# Patient Record
Sex: Male | Born: 1975 | Hispanic: Yes | Marital: Single | Smoking: Never smoker
Health system: Southern US, Community
[De-identification: ages and names within clinical notes are randomized; demographics above are authoritative.]

---

## 2014-04-02 ENCOUNTER — Emergency Department (HOSPITAL_COMMUNITY)
Admission: EM | Admit: 2014-04-02 | Discharge: 2014-04-02 | Disposition: A | Discharge status: Home / Self Care | Payer: Worker's Compensation | Attending: Emergency Medicine | Admitting: Emergency Medicine

## 2014-04-02 ENCOUNTER — Encounter (HOSPITAL_COMMUNITY): Payer: Self-pay | Admitting: Emergency Medicine

## 2014-04-02 ENCOUNTER — Telehealth: Payer: Self-pay | Admitting: Orthopedic Surgery

## 2014-04-02 ENCOUNTER — Emergency Department (HOSPITAL_COMMUNITY): Payer: Worker's Compensation

## 2014-04-02 DIAGNOSIS — M25561 Pain in right knee: Secondary | ICD-10-CM

## 2014-04-02 DIAGNOSIS — Y9339 Activity, other involving climbing, rappelling and jumping off: Secondary | ICD-10-CM | POA: Insufficient documentation

## 2014-04-02 DIAGNOSIS — Y9289 Other specified places as the place of occurrence of the external cause: Secondary | ICD-10-CM | POA: Insufficient documentation

## 2014-04-02 DIAGNOSIS — Y990 Civilian activity done for income or pay: Secondary | ICD-10-CM | POA: Insufficient documentation

## 2014-04-02 DIAGNOSIS — S99929A Unspecified injury of unspecified foot, initial encounter: Principal | ICD-10-CM

## 2014-04-02 DIAGNOSIS — S99919A Unspecified injury of unspecified ankle, initial encounter: Principal | ICD-10-CM

## 2014-04-02 DIAGNOSIS — X500XXA Overexertion from strenuous movement or load, initial encounter: Secondary | ICD-10-CM | POA: Insufficient documentation

## 2014-04-02 DIAGNOSIS — S8990XA Unspecified injury of unspecified lower leg, initial encounter: Secondary | ICD-10-CM | POA: Insufficient documentation

## 2014-04-02 MED ORDER — OXYCODONE-ACETAMINOPHEN 5-325 MG PO TABS: 2.0000 | ORAL_TABLET | ORAL | Status: AC | PRN

## 2014-04-02 NOTE — Discharge Instructions (Signed)
X-ray shows no fracture. Ice, elevate, knee brace, pain medication. Can also take ibuprofen 3-4 tablets 3 times a day. Work note given

## 2014-04-02 NOTE — ED Notes (Signed)
nad noted prior to dc. Dc instructions reviewed with pt and f/u instructions reviewed as well. Workers comp sheet reviewed and explained to give to employer as well. 1 script given prior to dc home . Pt encouraged to go to lab for drug screen

## 2014-04-02 NOTE — ED Provider Notes (Signed)
CSN: [id number]     Arrival date & time 04/02/14  0423 History   First MD Initiated Contact with Patient 04/02/14 0425     No chief complaint on file.  Chief complaint: Right knee injury  (Consider location/radiation/quality/duration/timing/severity/associated sxs/prior Treatment) HPI..... 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 Knee inverted and patient felt popping sensation. No other injuries. Severity is moderate. He cannot bear weight on his right leg today. He is normally healthy.  History reviewed. No pertinent past medical history. History reviewed. No pertinent past surgical history. No family history on file. History  Substance Use Topics  . Smoking status: Never Smoker   . Smokeless tobacco: Not on file  . Alcohol Use: No    Review of Systems  All other systems reviewed and are negative.     Allergies  Review of patient's allergies indicates no known allergies.  Home Medications   Prior to Admission medications   Not on File   BP 115/73  Pulse 58  Temp(Src) 98 F (36.7 C) (Oral)  Resp 16  Ht 5\' 8"  (1.727 m)  Wt 196 lb (88.905 kg)  BMI 29.81 kg/m2  SpO2 100% Physical Exam  Nursing note and vitals reviewed. Constitutional: He is oriented to person, place, and time. He appears well-developed and well-nourished.  HENT:  Head: Normocephalic and atraumatic.  Eyes: Conjunctivae and EOM are normal. Pupils are equal, round, and reactive to light.  Neck: Normal range of motion. Neck supple.  Cardiovascular: Normal rate, regular rhythm and normal heart sounds.   Pulmonary/Chest: Effort normal and breath sounds normal.  Abdominal: Soft. Bowel sounds are normal.  Musculoskeletal:  Right knee:   Most tender medially. Pain with range of motion  Neurological: He is alert and oriented to person, place, and time.  Skin: Skin is warm and dry.  Psychiatric: He has a normal mood and affect. His behavior is normal.    ED Course  Procedures  (including critical care time) Labs Review Labs Reviewed - No data to display  Imaging Review Dg Knee Complete 4 Views Right  04/02/2014   CLINICAL DATA:  Medial pain after inversion injury.  EXAM: RIGHT KNEE - COMPLETE 4+ VIEW  COMPARISON:  None.  FINDINGS: There is no evidence of fracture, dislocation, or joint effusion. There is no evidence of arthropathy or other focal bone abnormality. Soft tissues are unremarkable.  IMPRESSION: Negative.   Electronically Signed   By: Burman NievesWilliam  Stevens M.D.   On: 04/02/2014 06:24     EKG Interpretation None      MDM   Final diagnoses:  Right knee pain    X-ray of right knee shows no fracture. However I am concerned about a MCL injury or possible meniscus tear.  Followup with orthopedics. Ice. Immobilized. Discharge medications Percocet    Donnetta HutchingBrian Brennon Otterness, MD 04/02/14 [phone]

## 2014-04-02 NOTE — Telephone Encounter (Signed)
Received call from patient (in background), and from patient's sister-in-law, translating, that patient was treated at [facility] last night for a twisting injury to right knee (? At work)    * I called patient back to offer appointment and to obtain additional information; no voice message was set up on the phone-tried 2 x; no alternate ph# available.

## 2014-04-02 NOTE — ED Notes (Signed)
Pt states he jumped off of a roof yesterday, has injury and pain to right knee, no obvious deformity or swelling noted

## 2014-04-06 NOTE — Telephone Encounter (Signed)
No additional phone note on this encounter. See phone note same date.

## 2014-04-06 NOTE — Telephone Encounter (Signed)
Appointment scheduled, as the employer's portion of Worker's comp set up form received in fax.  Patient aware of appointment tomorrow, 04/07/14, and to arrive by 1:45p.m.  I also notified employer of the appointment, left voice message.

## 2014-04-06 NOTE — Telephone Encounter (Signed)
Received return call back Friday per Byrd HesselbachMaria, family member who translates for patient.  Explained the Workers Comp process.  She provided the name, phone and fax # of employer:  [employer], ph# [phone],  fax # [phone]).  Sent the workers comp set up forms tor patient's employer to complete; patient can complete his portion here with new pt forms, upon receipt.  Patient aware of status - appointment pending receipt of completed forms.

## 2014-04-07 ENCOUNTER — Ambulatory Visit (INDEPENDENT_AMBULATORY_CARE_PROVIDER_SITE_OTHER): Payer: Worker's Compensation | Admitting: Orthopedic Surgery

## 2014-04-07 ENCOUNTER — Encounter: Payer: Self-pay | Admitting: Orthopedic Surgery

## 2014-04-07 VITALS — BP 158/92

## 2014-04-07 DIAGNOSIS — IMO0002 Reserved for concepts with insufficient information to code with codable children: Secondary | ICD-10-CM

## 2014-04-07 DIAGNOSIS — S8390XA Sprain of unspecified site of unspecified knee, initial encounter: Secondary | ICD-10-CM

## 2014-04-07 DIAGNOSIS — S83209A Unspecified tear of unspecified meniscus, current injury, unspecified knee, initial encounter: Secondary | ICD-10-CM

## 2014-04-07 MED ORDER — HYDROCODONE-ACETAMINOPHEN 7.5-325 MG PO TABS: 1.0000 | ORAL_TABLET | ORAL | Status: DC | PRN

## 2014-04-07 MED ORDER — IBUPROFEN 800 MG PO TABS: 800.0000 mg | ORAL_TABLET | Freq: Three times a day (TID) | ORAL | Status: DC | PRN

## 2014-04-07 NOTE — Progress Notes (Signed)
Patient ID: Jason Robertson, male   DOB: 02/26/1976, 38 y.o.   MRN: [id number]  Chief Complaint  Patient presents with  . Knee Pain   38 year old male was injured at work. He was working on a roof he somehow got his foot caught and he fell injuring his right knee he cannot put pressure on his right foot. Complains of pain swelling locking stiffness giving out sharp throbbing stabbing discomfort constant 8/10 with mild relief from oxycodone 5. He went to the hospital x-rays were negative. Injury date was May 13 it was work-related  Is negative medical history negative surgical history is medication as stated oxycodone. He is also wearing a brace he is using crutches  No medical allergies  Family history of hypertension gastrointestinal disease parents are alive and well  Review of systems nothing other than stated.  He listed date of injury of May 13 time 2:30 while employed by [employer]. Psychologist, counsellingsteel service.  BP 158/92 General appearance is normal, the patient is alert and oriented x3 with normal mood and affect.  Respirations 18 pulse 78 regular  Crutches nonweightbearing brace.Upper extremity exam  The right and left upper extremity:   Inspection and palpation revealed no abnormalities in the upper extremities.   Range of motion is full without contracture.  Motor exam is normal with grade 5 strength.  The joints are fully reduced without subluxation.  There is no atrophy or tremor and muscle tone is normal.  All joints are stable.   Left lower extremity inspection no abnormalities, full range of motion, joint stability confirmed strength normal skin intact good pulses normal sensation  Right leg tender medial joint line medial collateral ligament. Range of motion limited to 50. Anterior cruciate ligament intact strength normal skin normal muscle tone normal pulses normal no lymph nodes are positive, sensation is normal. No pathologic reflexes  Balance normal.  X-rays  negative  Impression sprain knee probable MCL recommend MRI right knee  Meds ordered this encounter  Medications  . ibuprofen (ADVIL,MOTRIN) 800 MG tablet    Sig: Take 1 tablet (800 mg total) by mouth every 8 (eight) hours as needed.    Dispense:  90 tablet    Refill:  5  . HYDROcodone-acetaminophen (NORCO) 7.5-325 MG per tablet    Sig: Take 1 tablet by mouth every 4 (four) hours as needed for moderate pain.    Dispense:  84 tablet    Refill:  0   Orders Placed This Encounter  Procedures  . MR Knee Right Wo Contrast    Standing Status: Future     Number of Occurrences:      Standing Expiration Date: 06/08/2015    Order Specific Question:  Reason for Exam (SYMPTOM  OR DIAGNOSIS REQUIRED)    Answer:  meniscal tear    Order Specific Question:  Preferred imaging location?    Answer:  External    Order Specific Question:  Does the patient have a pacemaker or implanted devices?    Answer:  No    Order Specific Question:  What is the patient's sedation requirement?    Answer:  No Sedation    No work continue brace crutches weight-bear as tolerated medications ordered. Currently in a hinged economy brace

## 2014-04-07 NOTE — Patient Instructions (Addendum)
No work until diagnosis completed with MRI scan  Mri right knee take medications apply ice 3 x a day for 30 min   Meds ordered this encounter  Medications  . ibuprofen (ADVIL,MOTRIN) 800 MG tablet    Sig: Take 1 tablet (800 mg total) by mouth every 8 (eight) hours as needed.    Dispense:  90 tablet    Refill:  5  . HYDROcodone-acetaminophen (NORCO) 7.5-325 MG per tablet    Sig: Take 1 tablet by mouth every 4 (four) hours as needed for moderate pain.    Dispense:  84 tablet    Refill:  0

## 2014-04-08 ENCOUNTER — Telehealth: Payer: Self-pay | Admitting: Orthopedic Surgery

## 2014-04-08 NOTE — Telephone Encounter (Signed)
Contacted Workers Emergency planning/management officerComp insurer, [employer], spoke with adjuster assigned to the claim ([id number]), Building services engineerTimmy Baker.  Faxed notes, including out of work note, and order for MRI, and have requesting approval/authorization.  Awaiting response.  Patient aware of status.  (PH# [phone], FAX# [phone])

## 2014-04-15 NOTE — Telephone Encounter (Signed)
04/15/14 Received call back from [employer] requesting re-fax of same office notes w/MRI order which were faxed 04/08/14 and to include CLAIM# [id number] -- RE-faxed to same Fax # [phone], attention Harvie Heck, as per request.

## 2014-04-16 NOTE — Telephone Encounter (Signed)
Received approval via fax from Workers Comp's 3rd party Runner, broadcasting/film/video, [employer].  MRI has been pre-authorized; appointment scheduled at [facility], [facility] for tomorrow 04/17/14, 11:30am, pt aware. Order faxed to [phone] (ph [phone])  Patient to follow up here for results; appointment scheduled, and patient aware of this visit as well.

## 2014-04-23 ENCOUNTER — Ambulatory Visit (INDEPENDENT_AMBULATORY_CARE_PROVIDER_SITE_OTHER): Payer: Worker's Compensation | Admitting: Orthopedic Surgery

## 2014-04-23 ENCOUNTER — Encounter: Payer: Self-pay | Admitting: Orthopedic Surgery

## 2014-04-23 ENCOUNTER — Telehealth: Payer: Self-pay | Admitting: Orthopedic Surgery

## 2014-04-23 DIAGNOSIS — S83419A Sprain of medial collateral ligament of unspecified knee, initial encounter: Secondary | ICD-10-CM | POA: Insufficient documentation

## 2014-04-23 DIAGNOSIS — IMO0002 Reserved for concepts with insufficient information to code with codable children: Secondary | ICD-10-CM

## 2014-04-23 DIAGNOSIS — S8390XA Sprain of unspecified site of unspecified knee, initial encounter: Secondary | ICD-10-CM

## 2014-04-23 MED ORDER — HYDROCODONE-ACETAMINOPHEN 7.5-325 MG PO TABS: 1.0000 | ORAL_TABLET | ORAL | Status: DC | PRN

## 2014-04-23 NOTE — Telephone Encounter (Signed)
Faxed office notes and request for physical therapy provider for therapy appointment to be scheduled, Ref: CLAIM# [id number] --faxed to same Fax # [phone], attention Harvie Heck, as per request.

## 2014-04-23 NOTE — Patient Instructions (Addendum)
Wear brace for next 6 weeks OOW note Therapy to be approved by workers comp

## 2014-04-23 NOTE — Progress Notes (Signed)
Patient ID: Jason Robertson, male   DOB: 03/18/1976, 38 y.o.   MRN: [id number]  Chief Complaint  Patient presents with  . Follow-up    Workers compensation related injury May 13 right knee, MRI followup   Previous history HISTORY: Chief Complaint   Patient presents with   .  Knee Pain    38 year old male was injured at work. He was working on a roof he somehow got his foot caught and he fell injuring his right knee he cannot put pressure on his right foot. Complains of pain swelling locking stiffness giving out sharp throbbing stabbing discomfort constant 8/10 with mild relief from oxycodone 5. He went to the hospital x-rays were negative. Injury date was May 13 it was work-related  negative medical history negative surgical history is medication as stated oxycodone. He is also wearing a brace he is using crutches  Current complaints medial knee pain stiffness  Review of systems no new findings  Current treatment hinged knee brace, hydrocodone, anti-inflammatory and crutches.  Image interpretation and review of report  There is a bone contusion laterally and there is a proximal MCL tear with no anterior cruciate ligament tear no meniscal tear  Recommend physical therapy and continued bracing for 6 weeks without work status 6 weeks return 6 weeks continue current medication  Orders Placed This Encounter  Procedures  . Ambulatory referral to Physical Therapy    Referral Priority:  Routine    Referral Type:  Physical Medicine    Referral Reason:  Specialty Services Required    Requested Specialty:  Physical Therapy    Number of Visits Requested:  1   Meds ordered this encounter  Medications  . HYDROcodone-acetaminophen (NORCO) 7.5-325 MG per tablet    Sig: Take 1 tablet by mouth every 4 (four) hours as needed for moderate pain.    Dispense:  84 tablet    Refill:  0

## 2014-04-28 NOTE — Telephone Encounter (Signed)
Regarding physical therapy - Received fax from Workers Comp third Sales promotion account executive, [employer], requesting orders.  Faxed as requested 04/27/14 to [phone] as per request. * * 04/28/14, [employer] has faxed back approval/authorization for patient to be scheduled at:  [facility]) Ph# [phone], Fax [phone].  Orders faxed to their office --  appointment pending patient contacting them to schedule.  I have also tried to reach patient - voice message not working. * Keep following up *

## 2014-04-28 NOTE — Telephone Encounter (Signed)
Appointment is scheduled for physical therapy at [facility], [facility] location for 3/54/65, 8:30am, patient aware.

## 2014-06-11 ENCOUNTER — Telehealth: Payer: Self-pay | Admitting: Orthopedic Surgery

## 2014-06-11 ENCOUNTER — Ambulatory Visit (INDEPENDENT_AMBULATORY_CARE_PROVIDER_SITE_OTHER): Payer: Worker's Compensation | Admitting: Orthopedic Surgery

## 2014-06-11 ENCOUNTER — Encounter: Payer: Self-pay | Admitting: Orthopedic Surgery

## 2014-06-11 VITALS — BP 125/84 | Ht 68.0 in | Wt 196.0 lb

## 2014-06-11 DIAGNOSIS — Z5189 Encounter for other specified aftercare: Secondary | ICD-10-CM

## 2014-06-11 DIAGNOSIS — IMO0002 Reserved for concepts with insufficient information to code with codable children: Secondary | ICD-10-CM

## 2014-06-11 DIAGNOSIS — S8391XD Sprain of unspecified site of right knee, subsequent encounter: Secondary | ICD-10-CM

## 2014-06-11 DIAGNOSIS — S83419A Sprain of medial collateral ligament of unspecified knee, initial encounter: Secondary | ICD-10-CM

## 2014-06-11 DIAGNOSIS — S83411D Sprain of medial collateral ligament of right knee, subsequent encounter: Secondary | ICD-10-CM

## 2014-06-11 MED ORDER — HYDROCODONE-ACETAMINOPHEN 7.5-325 MG PO TABS: 1.0000 | ORAL_TABLET | ORAL | Status: DC | PRN

## 2014-06-11 MED ORDER — IBUPROFEN 800 MG PO TABS: 800.0000 mg | ORAL_TABLET | Freq: Three times a day (TID) | ORAL | Status: AC | PRN

## 2014-06-11 NOTE — Progress Notes (Signed)
Patient ID: Jason Robertson, male   DOB: [birth year], 38 y.o.   MRN: [id number]  Chief Complaint  Patient presents with  . Follow-up    recheck right knee s/p therapy, DOI 04/01/14    Encounter Diagnoses  Name Primary?  . Tear of MCL (medial collateral ligament) of knee, right, subsequent encounter Yes  . Sprain, knee, right, subsequent encounter     BP 125/84  Ht 5\' 8"  (1.727 m)  Wt 196 lb (88.905 kg)  BMI 29.81 kg/m2   MCL sprain right knee 8 weeks since injury plus a couple of days. Still complains of medial knee pain and inability to kneel and inability to squat. He has made improvements in terms of regaining full range of motion no swelling  Is still tender over the medial femoral condyle. He is ambulating normally now. The knee is stable but has pain with valgus stress motor exam is normal as a good distal pulse and normal sensation  Continue therapy 4 weeks Out of work 4 weeks Return 4 weeks

## 2014-06-11 NOTE — Telephone Encounter (Signed)
Notes faxed, date of service 06/11/14, including physical therapy order for continued therapy, to Workers comp insurer, [employer], to  fax# [phone], ph# [phone].  Awaiting approval [facility] [place].  Patient aware of status.

## 2014-06-11 NOTE — Patient Instructions (Signed)
CONTINUE THERAPY FOUR MORE WEEKS OOW FOUR MORE WEEKS FOLLOW UP IN FOUR WEEKS

## 2014-06-16 NOTE — Telephone Encounter (Signed)
06/15/14 Spoke with Workers comp ([employer]), Randi; therapy approved for additional 4 weeks. States Management consultantAlign Networks(their 3rd party provider) may also contact us directly. Order faxed to [facility], fax# [phone];  Called patient to notify, left message to return call if needed further information.

## 2014-06-24 ENCOUNTER — Encounter: Payer: Self-pay | Admitting: Orthopedic Surgery

## 2014-07-09 ENCOUNTER — Ambulatory Visit (INDEPENDENT_AMBULATORY_CARE_PROVIDER_SITE_OTHER): Payer: Worker's Compensation | Admitting: Orthopedic Surgery

## 2014-07-09 ENCOUNTER — Encounter: Payer: Self-pay | Admitting: Orthopedic Surgery

## 2014-07-09 VITALS — BP 140/87 | Ht 68.0 in | Wt 196.0 lb

## 2014-07-09 DIAGNOSIS — S8391XD Sprain of unspecified site of right knee, subsequent encounter: Secondary | ICD-10-CM

## 2014-07-09 DIAGNOSIS — IMO0002 Reserved for concepts with insufficient information to code with codable children: Secondary | ICD-10-CM | POA: Diagnosis not present

## 2014-07-09 DIAGNOSIS — S83419A Sprain of medial collateral ligament of unspecified knee, initial encounter: Secondary | ICD-10-CM | POA: Diagnosis not present

## 2014-07-09 DIAGNOSIS — Z5189 Encounter for other specified aftercare: Secondary | ICD-10-CM

## 2014-07-09 DIAGNOSIS — S83411D Sprain of medial collateral ligament of right knee, subsequent encounter: Secondary | ICD-10-CM

## 2014-07-09 MED ORDER — HYDROCODONE-ACETAMINOPHEN 7.5-325 MG PO TABS: 1.0000 | ORAL_TABLET | ORAL | Status: DC | PRN

## 2014-07-09 NOTE — Progress Notes (Signed)
Chief Complaint  Patient presents with  . Follow-up    follow up Right knee s/p therapy, DOI 04/01/14    BP 140/87  Ht 5\' 8"  (1.727 m)  Wt 196 lb (88.905 kg)  BMI 29.81 kg/m2  Repeat evaluation for this patient with the workers compensation injury back in May of this year. Injured his knee and sustained a medial collateral ligament injury as well as a bone contusion to the medial femoral condyle. He presents for further evaluation after finishing his physical therapy  His main complaints at this point of pain over the medial femoral condyle. He still having stiffness and occasional instability feeling in the knee. Using the knee brace is on ibuprofen and Norco for pain  Review of systems negative otherwise.  Exam shows tenderness over his medial femoral condyle medial tibial plateau he has slight opening but good springback with valgus stress at 30. Knee stable anteroposterior. He has 130 of flexion full extension of the right knee with 135 flexion left knee  At this point he still has some pain from the contusion to the knee. The knee feels very stable.  I think is a little slow to heal I think to speed things up it would be prudent to repeat his MRI to make sure that there is nothing else going on in the knee.  Otherwise out of work for 4 weeks, continue brace continue ibuprofen continue Norco. He will have a permanent partial impairment rating done in about 4 weeks when I see him next time.

## 2014-07-09 NOTE — Patient Instructions (Addendum)
OUT OF WORK 4 MORE WEEK WE WILL CALL YOU WITH MRI APPOINTMENT

## 2014-07-13 ENCOUNTER — Telehealth: Payer: Self-pay | Admitting: Orthopedic Surgery

## 2014-07-13 NOTE — Telephone Encounter (Signed)
Contacted Workers comp, Faxed notes + request for MRI authorization, to fax# [phone], ph# [phone]; awaiting approval; patient aware of status.

## 2014-07-17 NOTE — Telephone Encounter (Signed)
Per follow up call to Workers comp, spoke with [employer] claim adjuster [employer]; states we have verbal approval for MRI; awaiting "[employer]" process, which is their third party scheduling company.  Patient aware of status.

## 2014-07-28 NOTE — Telephone Encounter (Signed)
As of 07/23/14, fax received from [employer] (Work Comp third party contact) that patient was scheduled for MRI on 07/24/14 at [facility].  Called patient to notify; no voice message available.

## 2014-07-28 NOTE — Telephone Encounter (Signed)
Patient kept the MRI appointment as scheduled, 07/24/14, at [facility]; has a scheduled follow up appointment here for results.

## 2014-08-04 ENCOUNTER — Encounter: Payer: Self-pay | Admitting: Orthopedic Surgery

## 2014-08-06 ENCOUNTER — Encounter: Payer: Self-pay | Admitting: Orthopedic Surgery

## 2014-08-06 ENCOUNTER — Ambulatory Visit (INDEPENDENT_AMBULATORY_CARE_PROVIDER_SITE_OTHER): Payer: Worker's Compensation | Admitting: Orthopedic Surgery

## 2014-08-06 VITALS — BP 155/101 | Ht 68.0 in | Wt 196.0 lb

## 2014-08-06 DIAGNOSIS — IMO0002 Reserved for concepts with insufficient information to code with codable children: Secondary | ICD-10-CM

## 2014-08-06 DIAGNOSIS — Z5189 Encounter for other specified aftercare: Secondary | ICD-10-CM

## 2014-08-06 DIAGNOSIS — S8391XD Sprain of unspecified site of right knee, subsequent encounter: Secondary | ICD-10-CM

## 2014-08-06 MED ORDER — HYDROCODONE-ACETAMINOPHEN 7.5-325 MG PO TABS: 1.0000 | ORAL_TABLET | ORAL | Status: AC | PRN

## 2014-08-06 NOTE — Progress Notes (Signed)
Chief Complaint  Patient presents with  . Follow-up    MRI results Rt knee, w.comp, DOI 04/01/14    38 year old male was involved in a workers compensation injury injured his right knee sustained a medial collateral ligament injury he was still having pain after we thought the ligament had healed so we got approval for repeat MRI and it shows that his ligament did indeed feel. His bone contusions resolved. 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 All ligamentous and meniscal structures are intact  Today's examination reveals a well-developed well-nourished male he has a family relative with him who is interpreting for [place]. He is awake alert and oriented his mood and affect is normal he walks without support and without a limp. He still has tenderness over the medial femoral condyle there is no joint effusion the remaining portions of his knee are palpated and are nontender his range of motion is full he has some pain over the medial femoral condyle once he reaches 130 of flexion. His knee comes to full extension the collateral ligaments are stable the anterior cruciate ligament and PCL are stable he does have pain to valgus stress with the knee is at 30 but not at 0 muscle tone is normal skin is intact has good distal pulse and normal sensation distally.  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 No surgery deemed necessary.

## 2014-08-06 NOTE — Patient Instructions (Signed)
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

## 2014-08-12 ENCOUNTER — Telehealth: Payer: Self-pay | Admitting: Orthopedic Surgery

## 2014-08-12 NOTE — Telephone Encounter (Signed)
Faxed office visit notes date of service 08/06/14 to nurse case manager for workers comp insurer, [employer], fax# [phone]/ph# [phone].  She had accompanied patient to this visit; already has copy of work note.

## 2014-08-14 ENCOUNTER — Encounter: Payer: Self-pay | Admitting: Orthopedic Surgery

## 2014-08-31 ENCOUNTER — Telehealth: Payer: Self-pay | Admitting: Orthopedic Surgery

## 2014-08-31 NOTE — Telephone Encounter (Signed)
Patient's sister and designated party contact, Marlowe ShoresMaria Duque, called to relay that patient's employer has no light duty work available; therefore, has not been working - refer to last visit note of 08/06/14.  Patient has been treating for work-related injury, 04/02/14 as noted.    - I have also called and left message for Spring A, patient's nurse case manager.  No follow up appointment is scheduled at this time, pending appointment with another specialist.

## 2014-09-01 NOTE — Telephone Encounter (Signed)
Received call back from nurse case manager, Spring Abney, [place] [phone]; she explained that she is awaiting patient (and patient's contact, Byrd HesselbachMaria) to confirm the referral appointment for tomorrow, 09/01/14, with specialist regarding work hardening.  I called back to patient - they will call back to Spring, to further discuss attending the scheduled referral appointment.

## 2015-10-19 IMAGING — CR DG KNEE COMPLETE 4+V*R*
4 series · 4 of 4 positions shown · non-contrast
Comparison: None.

CLINICAL DATA: Medial pain after inversion injury.

EXAM:
RIGHT KNEE - COMPLETE 4+ VIEW

[view not recorded (1 of 4)]
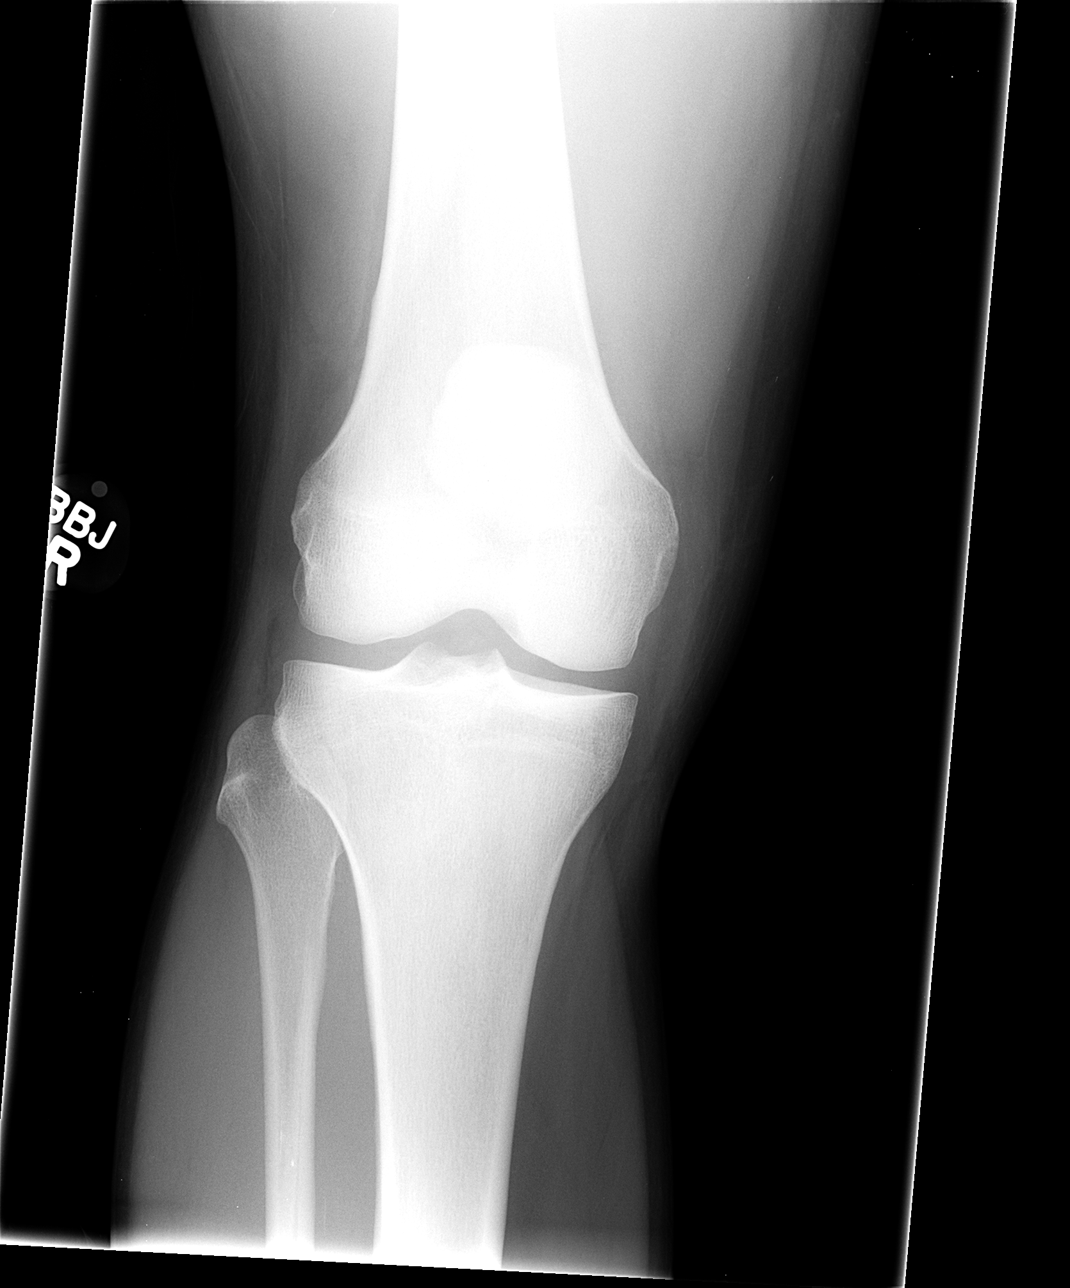

[view not recorded (2 of 4)]
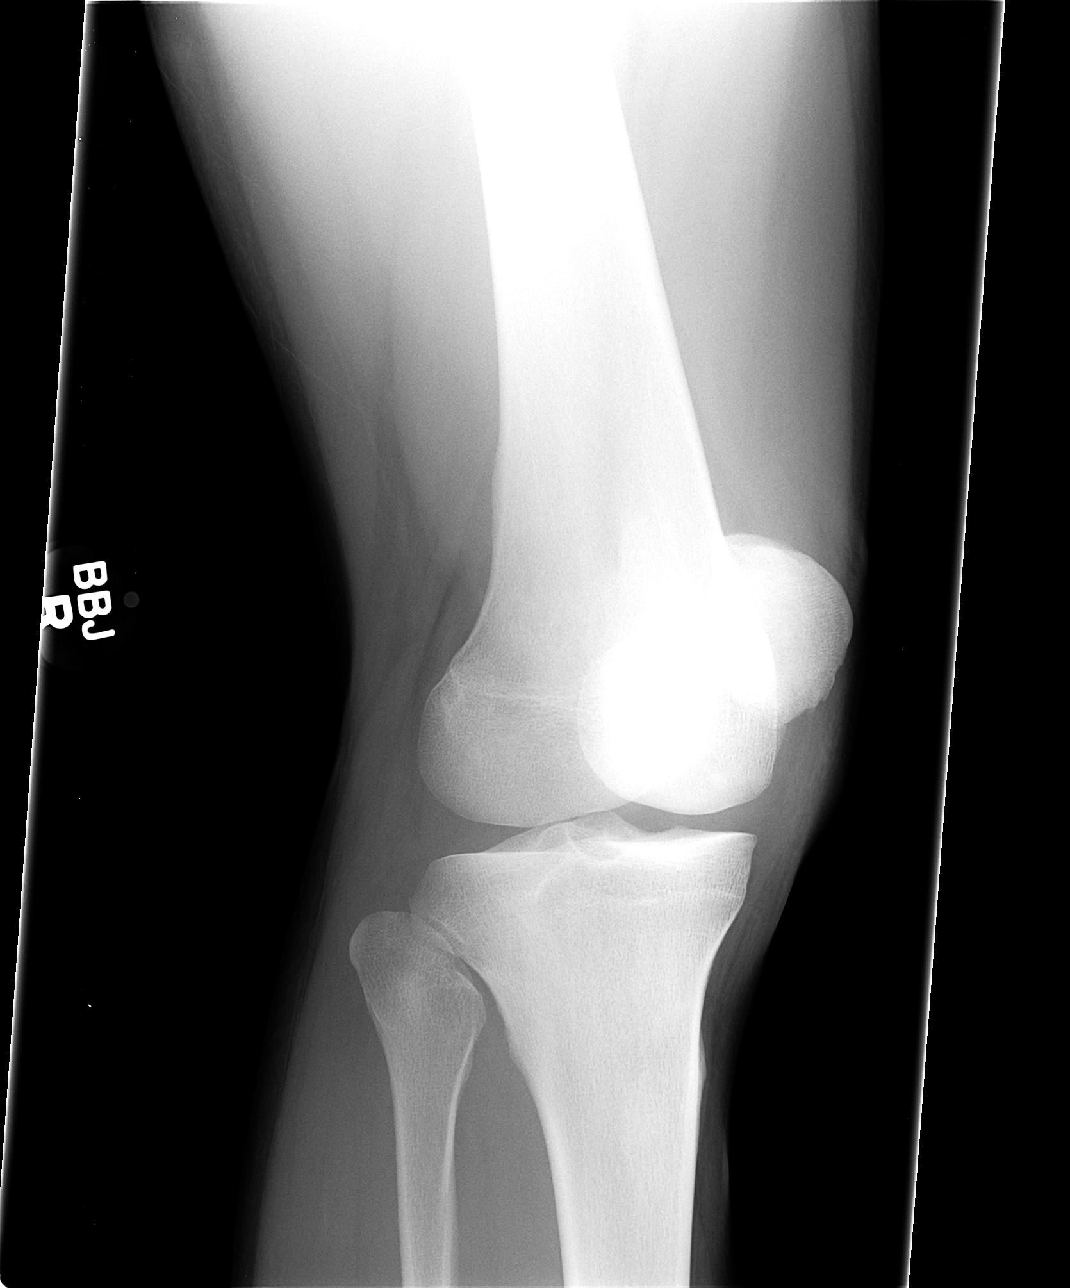

[view not recorded (3 of 4)]
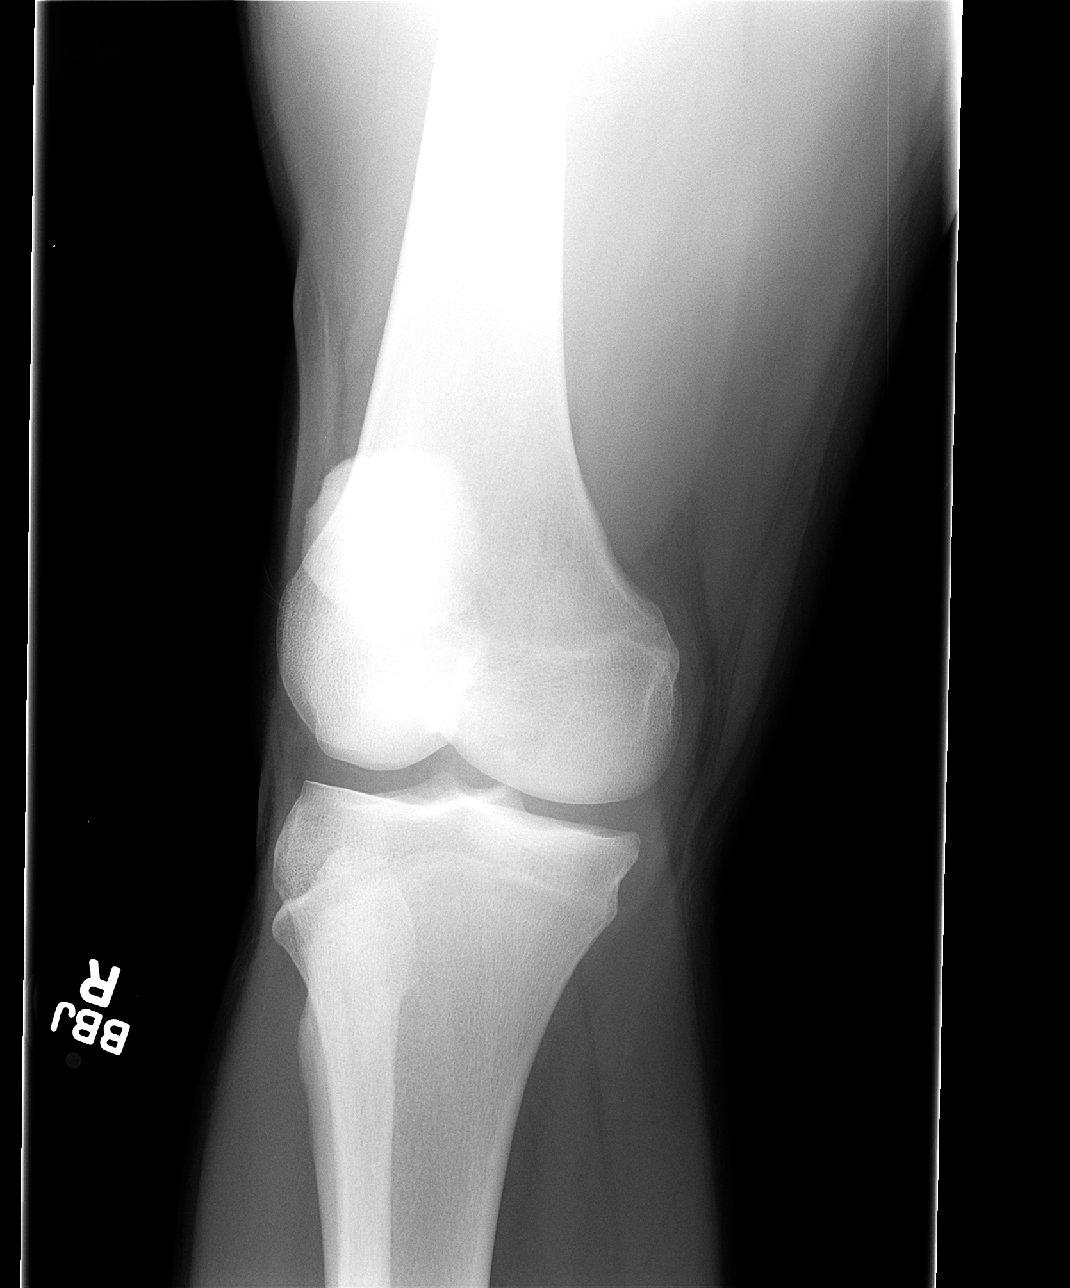

[view not recorded (4 of 4)]
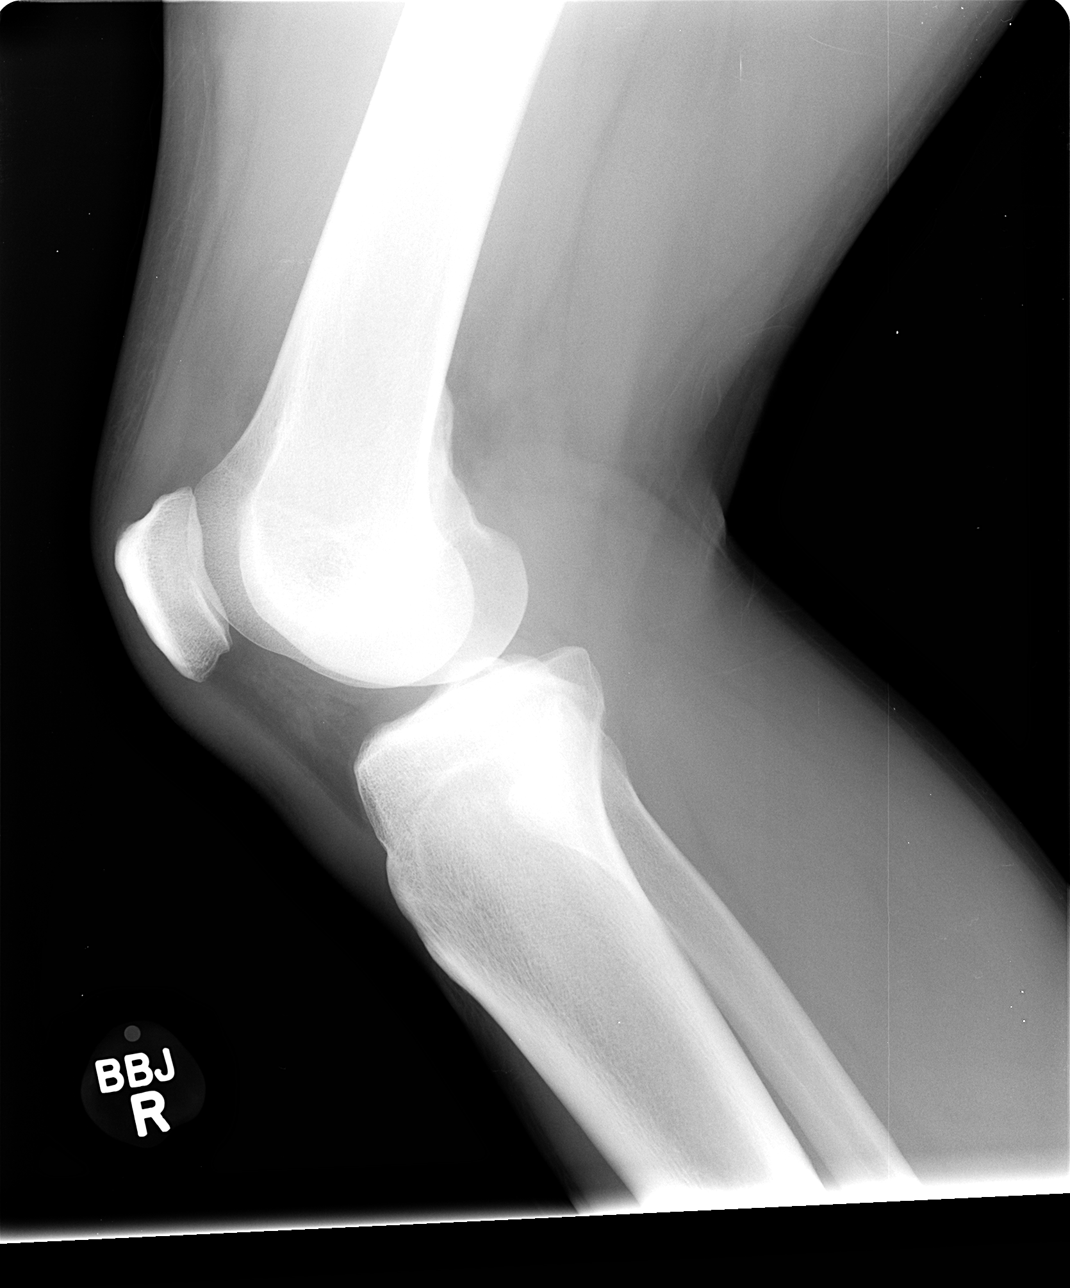

[4 of 4 positions shown; findings below may reference images not displayed]

FINDINGS: There is no evidence of fracture, dislocation, or joint effusion.
There is no evidence of arthropathy or other focal bone abnormality.
Soft tissues are unremarkable.
IMPRESSION: Negative.
